# Patient Record
Sex: Male | Born: 1980 | Race: White | Hispanic: No | Marital: Single | Smoking: Current every day smoker
Health system: Southern US, Community
[De-identification: ages and names within clinical notes are randomized; demographics above are authoritative.]

## PROBLEM LIST (undated history)

## (undated) DIAGNOSIS — F419 Anxiety disorder, unspecified: Secondary | ICD-10-CM

## (undated) DIAGNOSIS — F410 Panic disorder [episodic paroxysmal anxiety] without agoraphobia: Secondary | ICD-10-CM

## (undated) HISTORY — PX: APPENDECTOMY: SHX54

## (undated) HISTORY — PX: HERNIA REPAIR: SHX51

---

## 2014-04-10 ENCOUNTER — Emergency Department (HOSPITAL_COMMUNITY)
Admission: EM | Admit: 2014-04-10 | Discharge: 2014-04-10 | Discharge status: Against Medical Advice | Payer: Medicaid Other | Source: Home / Self Care | Attending: Emergency Medicine | Admitting: Emergency Medicine

## 2014-04-10 ENCOUNTER — Emergency Department (HOSPITAL_COMMUNITY)
Admission: EM | Admit: 2014-04-10 | Discharge: 2014-04-10 | Disposition: A | Discharge status: Home / Self Care | Payer: Medicaid Other | Attending: Emergency Medicine | Admitting: Emergency Medicine

## 2014-04-10 ENCOUNTER — Encounter (HOSPITAL_COMMUNITY): Payer: Self-pay | Admitting: Emergency Medicine

## 2014-04-10 ENCOUNTER — Emergency Department (HOSPITAL_COMMUNITY): Payer: Medicaid Other

## 2014-04-10 DIAGNOSIS — R0789 Other chest pain: Secondary | ICD-10-CM | POA: Diagnosis not present

## 2014-04-10 DIAGNOSIS — R079 Chest pain, unspecified: Secondary | ICD-10-CM | POA: Insufficient documentation

## 2014-04-10 DIAGNOSIS — R51 Headache: Secondary | ICD-10-CM | POA: Insufficient documentation

## 2014-04-10 DIAGNOSIS — R1030 Lower abdominal pain, unspecified: Secondary | ICD-10-CM | POA: Insufficient documentation

## 2014-04-10 DIAGNOSIS — Z720 Tobacco use: Secondary | ICD-10-CM | POA: Insufficient documentation

## 2014-04-10 DIAGNOSIS — Z79899 Other long term (current) drug therapy: Secondary | ICD-10-CM | POA: Insufficient documentation

## 2014-04-10 DIAGNOSIS — F410 Panic disorder [episodic paroxysmal anxiety] without agoraphobia: Secondary | ICD-10-CM | POA: Diagnosis not present

## 2014-04-10 DIAGNOSIS — R110 Nausea: Secondary | ICD-10-CM | POA: Diagnosis not present

## 2014-04-10 DIAGNOSIS — Z9119 Patient's noncompliance with other medical treatment and regimen: Secondary | ICD-10-CM

## 2014-04-10 DIAGNOSIS — Z5320 Procedure and treatment not carried out because of patient's decision for unspecified reasons: Secondary | ICD-10-CM

## 2014-04-10 HISTORY — DX: Panic disorder (episodic paroxysmal anxiety): F41.0

## 2014-04-10 HISTORY — DX: Anxiety disorder, unspecified: F41.9

## 2014-04-10 LAB — BASIC METABOLIC PANEL
Anion gap: 19 — ABNORMAL HIGH (ref 5–15)
BUN: 8 mg/dL (ref 6–23)
CO2: 19 meq/L (ref 19–32)
CREATININE: 0.63 mg/dL (ref 0.50–1.35)
Calcium: 10.3 mg/dL (ref 8.4–10.5)
Chloride: 106 mEq/L (ref 96–112)
GFR calc Af Amer: >90 mL/min (ref >90)
GFR calc non Af Amer: >90 mL/min (ref >90)
GLUCOSE: 83 mg/dL (ref 70–99)
Potassium: 3.3 mEq/L — ABNORMAL LOW (ref 3.7–5.3)
Sodium: 144 mEq/L (ref 137–147)

## 2014-04-10 LAB — CBC
HEMATOCRIT: 38.7 % — AB (ref 39.0–52.0)
HEMOGLOBIN: 14 g/dL (ref 13.0–17.0)
MCH: 31.9 pg (ref 26.0–34.0)
MCHC: 36.2 g/dL — ABNORMAL HIGH (ref 30.0–36.0)
MCV: 88.2 fL (ref 78.0–100.0)
Platelets: 337 10*3/uL (ref 150–400)
RBC: 4.39 MIL/uL (ref 4.22–5.81)
RDW: 12.7 % (ref 11.5–15.5)
WBC: 8.8 10*3/uL (ref 4.0–10.5)

## 2014-04-10 LAB — RAPID URINE DRUG SCREEN, HOSP PERFORMED
Amphetamines: NOT DETECTED
BENZODIAZEPINES: NOT DETECTED
Barbiturates: NOT DETECTED
COCAINE: NOT DETECTED
Opiates: NOT DETECTED
Tetrahydrocannabinol: NOT DETECTED

## 2014-04-10 LAB — I-STAT TROPONIN, ED: Troponin i, poc: 0.01 ng/mL (ref 0.00–0.08)

## 2014-04-10 LAB — HEPATIC FUNCTION PANEL
ALT: 11 U/L (ref 0–53)
AST: 17 U/L (ref 0–37)
Albumin: 4.9 g/dL (ref 3.5–5.2)
Alkaline Phosphatase: 77 U/L (ref 39–117)
Total Bilirubin: <0.2 mg/dL — ABNORMAL LOW (ref 0.3–1.2)
Total Protein: 8.1 g/dL (ref 6.0–8.3)

## 2014-04-10 LAB — ETHANOL: Alcohol, Ethyl (B): <11 mg/dL (ref 0–11)

## 2014-04-10 LAB — LIPASE, BLOOD: LIPASE: 13 U/L (ref 11–59)

## 2014-04-10 MED ORDER — SODIUM CHLORIDE 0.9 % IV BOLUS (SEPSIS)
1000.0000 mL | Freq: Once | INTRAVENOUS | Status: AC
  Administered 2014-04-10: 1000 mL via INTRAVENOUS

## 2014-04-10 MED ORDER — LORAZEPAM 2 MG/ML IJ SOLN
2.0000 mg | Freq: Once | INTRAMUSCULAR | Status: AC
  Administered 2014-04-10: 2 mg via INTRAVENOUS
  Filled 2014-04-10: qty 1

## 2014-04-10 MED ORDER — SODIUM CHLORIDE 0.9 % IV BOLUS (SEPSIS): 1000.0000 mL | Freq: Once | INTRAVENOUS | Status: DC

## 2014-04-10 MED ORDER — NITROGLYCERIN 0.4 MG SL SUBL
SUBLINGUAL_TABLET | SUBLINGUAL | Status: AC
  Administered 2014-04-10: 0.4 mg via SUBLINGUAL
  Filled 2014-04-10: qty 1

## 2014-04-10 MED ORDER — ONDANSETRON HCL 4 MG/2ML IJ SOLN
4.0000 mg | Freq: Once | INTRAMUSCULAR | Status: AC
  Administered 2014-04-10: 4 mg via INTRAVENOUS
  Filled 2014-04-10: qty 2

## 2014-04-10 MED ORDER — HYDROMORPHONE HCL 1 MG/ML IJ SOLN
1.0000 mg | Freq: Once | INTRAMUSCULAR | Status: AC
  Administered 2014-04-10: 1 mg via INTRAVENOUS
  Filled 2014-04-10: qty 1

## 2014-04-10 MED ORDER — OXYCODONE-ACETAMINOPHEN 5-325 MG PO TABS
1.0000 | ORAL_TABLET | Freq: Once | ORAL | Status: AC
  Administered 2014-04-10: 1 via ORAL
  Filled 2014-04-10: qty 1

## 2014-04-10 MED ORDER — NITROGLYCERIN 0.4 MG SL SUBL
0.4000 mg | SUBLINGUAL_TABLET | SUBLINGUAL | Status: DC | PRN
  Administered 2014-04-10: 0.4 mg via SUBLINGUAL
  Filled 2014-04-10: qty 1

## 2014-04-10 NOTE — ED Provider Notes (Signed)
CSN: [id number]     Arrival date & time 04/10/14  1308 History   First MD Initiated Contact with Patient 04/10/14 1325     Chief Complaint  Patient presents with  . Chest Pain  . Abdominal Pain      HPI Pt was seen at 1330.  Per EMS, pt and pt's family, c/o sudden onset and persistence of constant mid-sternal chest "pain" that began PTA. Pt [facility] the science center and his symptoms began while in the snake room. Pt describes the CP as "sharp," was associated with diaphoresis, nausea and abd pain. EMS gave ASA and SL ntg without change in symptoms. Pt's family states pt has significant hx of anxiety and panic attacks, rx xanax TID. Pt describes his symptoms as similar to his usual panic attack. Denies palpitations, no SOB/cough, no vomiting/diarrhea, no back pain, no calf/LE pain or unilateral swelling, no fevers, no syncope.    Past Medical History  Diagnosis Date  . Anxiety   . Panic attack     Past Surgical History  Procedure Laterality Date  . Hernia repair    . Appendectomy      History  Substance Use Topics  . Smoking status: Current Every Day Smoker -- 1.00 packs/day  . Smokeless tobacco: Not on file  . Alcohol Use: No    Review of Systems ROS: Statement: All systems negative except as marked or noted in the HPI; Constitutional: Negative for fever and chills. ; ; Eyes: Negative for eye pain, redness and discharge. ; ; ENMT: Negative for ear pain, hoarseness, nasal congestion, sinus pressure and sore throat. ; ; Cardiovascular: +CP. Negative for palpitations, diaphoresis, dyspnea and peripheral edema. ; ; Respiratory: Negative for cough, wheezing and stridor. ; ; Gastrointestinal: +nausea, abd pain. Negative for vomiting, diarrhea, blood in stool, hematemesis, jaundice and rectal bleeding. . ; ; Genitourinary: Negative for dysuria, flank pain and hematuria. ; ; Musculoskeletal: Negative for back pain and neck pain. Negative for swelling and trauma.; ; Skin:  Negative for pruritus, rash, abrasions, blisters, bruising and skin lesion.; ; Neuro: Negative for headache, lightheadedness and neck stiffness. Negative for weakness, altered level of consciousness , altered mental status, extremity weakness, paresthesias, involuntary movement, seizure and syncope.; Psych:  +anxiety, panic attack. No SI, no SA, no HI, no hallucinations.      Allergies  Review of patient's allergies indicates no known allergies.  Home Medications   Prior to Admission medications   Medication Sig Start Date End Date Taking? Authorizing Provider  ALPRAZolam Prudy Feeler(XANAX) 1 MG tablet Take 1 mg by mouth 3 (three) times daily.   Yes Historical Provider, MD   BP 129/80  Pulse 86  Temp(Src) 98.5 F (36.9 C) (Oral)  Resp 16  Ht 5\' 7"  (1.702 m)  Wt 185 lb (83.915 kg)  BMI 28.97 kg/m2  SpO2 100% Physical Exam 1335: Physical examination:  Nursing notes reviewed; Vital signs and O2 SAT reviewed;  Constitutional: Well developed, Well nourished, Well hydrated, In no acute distress; Head:  Normocephalic, atraumatic; Eyes: EOMI, PERRL, No scleral icterus; ENMT: Mouth and pharynx normal, Mucous membranes moist; Neck: Supple, Full range of motion, No lymphadenopathy; Cardiovascular: Tachycardic rate and rhythm, No gallop; Respiratory: Breath sounds clear & equal bilaterally, No wheezes.  Speaking full sentences with ease, Normal respiratory effort/excursion; Chest: Nontender, Movement normal; Abdomen: Soft, Nontender, Nondistended, Normal bowel sounds; Genitourinary: No CVA tenderness; Extremities: Pulses normal, No tenderness, No edema, No calf edema or asymmetry.; Neuro: AA&Ox3, Major CN grossly intact.  Speech clear. No gross focal motor or sensory deficits in extremities.; Skin: Color normal, Warm, Dry.; Psych:  Anxious, generalized trembling, eyes closed with eyelids fluttering.   ED Course  Procedures     EKG Interpretation   Date/Time:  Friday April 10 2014 13:54:36  EDT Ventricular Rate:  116 PR Interval:  133 QRS Duration: 93 QT Interval:  315 QTC Calculation: 437 R Axis:   69 Text Interpretation:  Sinus tachycardia Borderline repolarization  abnormality Baseline wander Since last tracing of earlier today No  significant change was found Confirmed by [facility]  MD, Nicholos Johns [phone])  on 04/10/2014 2:39:53 PM      MDM  MDM Reviewed: nursing note and vitals Interpretation: labs, ECG and x-ray    Results for orders placed during the hospital encounter of 04/10/14  CBC      Result Value Ref Range   WBC 8.8  4.0 - 10.5 K/uL   RBC 4.39  4.22 - 5.81 MIL/uL   Hemoglobin 14.0  13.0 - 17.0 g/dL   HCT 60.4 (*) 54.0 - 98.1 %   MCV 88.2  78.0 - 100.0 fL   MCH 31.9  26.0 - 34.0 pg   MCHC 36.2 (*) 30.0 - 36.0 g/dL   RDW 19.1  47.8 - 29.5 %   Platelets 337  150 - 400 K/uL  BASIC METABOLIC PANEL      Result Value Ref Range   Sodium 144  137 - 147 mEq/L   Potassium 3.3 (*) 3.7 - 5.3 mEq/L   Chloride 106  96 - 112 mEq/L   CO2 19  19 - 32 mEq/L   Glucose, Bld 83  70 - 99 mg/dL   BUN 8  6 - 23 mg/dL   Creatinine, Ser 6.21  0.50 - 1.35 mg/dL   Calcium 30.8  8.4 - 65.7 mg/dL   GFR calc non Af Amer >90  >90 mL/min   GFR calc Af Amer >90  >90 mL/min   Anion gap 19 (*) 5 - 15  URINE RAPID DRUG SCREEN (HOSP PERFORMED)      Result Value Ref Range   Opiates NONE DETECTED  NONE DETECTED   Cocaine NONE DETECTED  NONE DETECTED   Benzodiazepines NONE DETECTED  NONE DETECTED   Amphetamines NONE DETECTED  NONE DETECTED   Tetrahydrocannabinol NONE DETECTED  NONE DETECTED   Barbiturates NONE DETECTED  NONE DETECTED  HEPATIC FUNCTION PANEL      Result Value Ref Range   Total Protein 8.1  6.0 - 8.3 g/dL   Albumin 4.9  3.5 - 5.2 g/dL   AST 17  0 - 37 U/L   ALT 11  0 - 53 U/L   Alkaline Phosphatase 77  39 - 117 U/L   Total Bilirubin <0.2 (*) 0.3 - 1.2 mg/dL   Bilirubin, Direct <8.4  0.0 - 0.3 mg/dL   Indirect Bilirubin NOT CALCULATED  0.3 - 0.9 mg/dL   LIPASE, BLOOD      Result Value Ref Range   Lipase 13  11 - 59 U/L  I-STAT TROPOININ, ED      Result Value Ref Range   Troponin i, poc 0.01  0.00 - 0.08 ng/mL   Comment 3            Dg Chest Port 1 View 04/10/2014   CLINICAL DATA:  Sudden onset chest pain.  EXAM: PORTABLE CHEST - 1 VIEW  COMPARISON:  None.  FINDINGS: The heart size and mediastinal contours are within normal  limits. Both lungs are clear. The visualized skeletal structures are unremarkable.  IMPRESSION: No active disease.   Electronically Signed   By: Maisie Fushomas  Register   On: 04/10/2014 14:14    1630:  Pt extremely anxious on arrival, trembling, laying with his eyes closed, eyelids fluttering; requesting "some pain medicines." Meds given for anxiety and pain. HR improved. BP remains stable. Pt has tol PO well without N/V. Abd remains benign. Doubt PE as cause for symptoms with low risk [place].  Doubt ACS as cause for symptoms with normal troponin and EKG, with TIMI 0/Heart score 0. Pt states he's ready to go home now. Dx and testing d/w pt.  Questions answered.  Verb understanding, agreeable to d/c home with outpt f/u.    Samuel JesterKathleen Lakeena Downie, DO 04/13/14 1719

## 2014-04-10 NOTE — ED Notes (Signed)
Portable Chest Xray at the bedside.  

## 2014-04-10 NOTE — ED Notes (Signed)
Lab results  Was given to Dr.McManus.

## 2014-04-10 NOTE — Discharge Instructions (Signed)
°Emergency Department Resource Guide °1) Find a Doctor and Pay Out of Pocket °Although you won't have to find out who is covered by your insurance plan, it is a good idea to ask around and get recommendations. You will then need to call the office and see if the doctor you have chosen will accept you as a new patient and what types of options they offer for patients who are self-pay. Some doctors offer discounts or will set up payment plans for their patients who do not have insurance, but you will need to ask so you aren't surprised when you get to your appointment. ° °2) Contact Your Local Health Department °Not all health departments have doctors that can see patients for sick visits, but many do, so it is worth a call to see if yours does. If you don't know where your local health department is, you can check in your phone book. The CDC also has a tool to help you locate your state's health department, and many state websites also have listings of all of their local health departments. ° °3) Find a Walk-in Clinic °If your illness is not likely to be very severe or complicated, you may want to try a walk in clinic. These are popping up all over the country in pharmacies, drugstores, and shopping centers. They're usually staffed by nurse practitioners or physician assistants that have been trained to treat common illnesses and complaints. They're usually fairly quick and inexpensive. However, if you have serious medical issues or chronic medical problems, these are probably not your best option. ° °No Primary Care Doctor: °- Call [facility] at  832-8000 - they can help you locate a primary care doctor that  accepts your insurance, provides certain services, etc. °- Physician Referral Service- 1-800-533-3463 ° °Chronic Pain Problems: °Organization         Address  Phone   Notes  °[facility]  (336) 297-2271 Patients need to be referred by their primary care doctor.  ° °Medication  Assistance: °Organization         Address  Phone   Notes  °[place] [facility] [address] °[place], [place] [address] (336) 641-8030 --Must be a resident of [place] °-- Must have NO insurance coverage whatsoever (no Medicaid/ Medicare, etc.) °-- The pt. MUST have a primary care doctor that directs their care regularly and follows them in the community °  °[facility]  (866) 331-1348   °[employer]  (888) 892-1162   ° °Agencies that provide inexpensive medical care: °Organization         Address  Phone   Notes  °[facility]  (336) 832-8035   °[facility]    (336) 832-7272   °[facility] [address] °[place], [place] [address] (336) 832-4777   °[facility] [address], °[place] (336) 271-4999   °[facility]    (336) 373-0678   °[facility]    (336) 272-1050   °[facility] ° [address], [place] Phone:  (336) 832-4444, Fax:  (336) 832-4440 Hours of Operation:  9 am - 6 pm, M-F.  Also accepts Medicaid/Medicare and self-pay.  °[facility] ° [address], [place] Phone: (336) 832-3150, Fax: (336) [phone]. Hours of Operation:  8:30 am - 5:30 pm, M-F.  Also accepts Medicaid and self-pay.  °[facility] [place] [address], [place] Phone: (336) 878-6027   °[facility] [address], [place], [place] (336)723-1848, Ext. 123 Mondays & Thursdays: 7-9 AM.  First 15 patients are seen on a first come, first serve basis. °  ° °Medicaid-accepting [place] Providers: ° °Organization         Address  Phone   Notes  °[facility] [address], [place] (336) 641-2100 Also accepts self-pay patients.  °[facility] [address], [place] ° (336) 856-9996   °[facility] [address], [place]  (336) 288-8857   °[facility] [address] [place] [address], [place] (336) 299-7000   °Veita Bland [address], [place]  ° (336) 373-1557 Only accepts [place] [facility] Medicaid patients after they have their name applied to their card.  ° °Self-Pay (no insurance) in [place]: ° °Organization         Address  Phone   Notes  °Sickle Cell Patients, [facility] [address], [place] (336) 832-1970   °[facility] [address], [place] (336) 832-4400   ° [facility] ° [address], [place] (336) 992-4800   °[facility]/Dr. Osei-Bonsu ° [address] [place] [address], [place] or [address], [place] (336) [phone] Phone number for both [place] and [place] locations is the same.  °[facility] [address], [place] (336) 299-0000   °[facility] [address] [place] [address], [place] or [address] (336) 852-7530 °(336) 878-2260   °[facility] [address], [place] (336) 350-1642, phone; (336) 294-5005, fax Sees patients 1st and 3rd Saturday of every month.  Must not qualify for public or private insurance (i.e. Medicaid, Medicare, [place] Health Choice, Veterans' Benefits) • Household income should be no more than 200% of the poverty level •The clinic cannot treat you if you are pregnant or think you are pregnant • Sexually transmitted diseases are not treated at the clinic.  ° ° °Dental Care: °Organization         Address  Phone  Notes  °[place] [facility] [address], [place] (336) 641-6152 Accepts children up to age 21 who are enrolled in Medicaid or [place] Health Choice; pregnant women with a Medicaid card; and children who have applied for Medicaid or [place] Health Choice, but were declined, whose parents can pay a reduced fee at time of service.  °[place]  [facility] [place]  [address], [place] (336) 641-7733 Accepts children up to age 21 who are enrolled in Medicaid or [place] Health Choice; pregnant women with a Medicaid card; and children who have applied for Medicaid or [place] Health Choice, but were declined, whose parents can pay a reduced fee at time of service.  °[facility] ° [address],  (336) 641-4533 Patients are seen by appointment only. Walk-ins are not accepted. [facility] will see patients 18 years of age and older. °Monday - Tuesday (8am-5pm) °Most Wednesdays (8:30-5pm) °$30 per visit, cash only  °[facility] ° [address], [place] (336) 641-4533 Patients are seen by appointment only. Walk-ins are not accepted. [facility] will see patients 18 years of age and older. °One   Wednesday Evening (Monthly: Volunteer Based).  $30 per visit, cash only  °[facility]  (919) 537-3737 for adults; Children under age 4, call Graduate Pediatric Dentistry at ([phone]) [phone]. Children aged 4-14, please call (919) [phone] to request a pediatric application. ° Dental services are provided in all areas of dental care including fillings, crowns and bridges, complete and partial dentures, implants, gum treatment, root canals, and extractions. Preventive care is also provided. Treatment is provided to both adults and children. °Patients are selected via a lottery and there is often a waiting list. °  °[facility] [address], °[place] ° (336) 763-8833 [email or web address] °  °[facility] [address], [place], [place] (336)723-1848, Ext. 123 Second and Fourth Thursday of each month, opens at 6:30 AM; Clinic ends at 9 AM.  Patients are seen on a first-come first-served basis, and a limited number are seen during each clinic.  ° °[facility] ° [address], [place], [place] (336) 723-7904    Eligibility Requirements °You must have lived in [place], [place], or [place] counties for at least the last three months. °  You cannot be eligible for state or federal sponsored healthcare insurance, including Veterans Administration, Medicaid, or Medicare. °  You generally cannot be eligible for healthcare insurance through your employer.  °  How to apply: °Eligibility screenings are held every Tuesday and Wednesday afternoon from 1:00 pm until 4:00 pm. You do not need an appointment for the interview!  °[facility] [address], [place], [place] 336-631-2330   °[facility]  336-342-8273   °[facility]  336-703-3100   °[facility]  336-570-6415   ° °Behavioral Health Resources in the Community: °Intensive Outpatient Programs °Organization         Address  Phone  Notes  °[facility] [address], [place], [place] 336-878-6098   °[facility] [address], [place], [place] 336-832-9800   °[facility] [address], [place], [place] ° 336-882-2125   °[facility] [address],  °[place], [place] 1-800-853-5163 or 336-641-4981   °Substance Abuse Resources °Organization         Address  Phone  Notes  °[facility]  336-882-2125   °[facility]  336-784-9470   °[facility]  336-285-9073   °[facility]  336-845-3988   °Residential & Outpatient Substance Abuse Program  1-800-659-3381   °Psychological Services °Organization         Address  Phone  Notes  °[facility]  336- 832-9600   °[facility]  336- 378-7881   °[facility] [address], [place] 1-800-853-5163 or 336-641-4981   ° °Mobile Crisis Teams °Organization         Address  Phone  Notes  °[facility], Mobile Crisis Care Unit  1-877-626-1772   °Assertive °[facility] ° [address]  [place], [place] 336-834-9664   °Sharon DeEsch [address] °[place] 336-554-5454   ° °Self-Help/Support Groups °Organization         Address  Phone             Notes  °[facility]  - variety of support groups  336- 373-1402 Call for more information  °Narcotics Anonymous (NA), [facility] [address], °[place]  2 meetings at this location  ° °  Residential Treatment Programs °Organization         Address  Phone  Notes  °[facility] [address],    °[place]  1-866-801-8205   °[facility] ° [address], [place], [place] 704-293-8524   °[facility] [address], [place] 336-845-3988 Admissions: 8am-3pm M-F  °[facility] [address]    °[place], [place] 336-841-1104   °[facility] [address], [place], [place] 336-379-7146   °[facility] [address]  °[place], [place] 336-285-9073   °[facility] Intensive Outpatient [address], [place], [place] 336-852-3033   °[facility] [address]  °[place], [place] 1-877-615-2722 or 336-784-9470   °[facility] [address] [place], [place] 336-227-7417 Accepts Medicaid  °[facility] [address]  °[place] 1-800-659-3381 Substance Abuse/Addiction Treatment  ° °[place] Behavioral Health Resources °Organization         Address  Phone  Notes  °[facility]  (888) 581-9988   °Julie Brannon, PhD [address] [place], [place]   (336) 349-5553 or (336) 951-0000   °[facility]   [address] °[place], [place] (336) 349-4454   °[facility] [address], [place], [place] (336) 342-8316 Insurance/Medicaid/sponsorship through [facility]  °[facility] [address]                                    [place], [place] (336) 342-8316 Therapy/tele-psych/case    °[facility] [address]  ° [place], [place] (336) 349-2233    °Dr. Arfeen  (336) 349-4544   °[facility] [place]  [employer] [place] [facility] 1) [address], [place] °2) [address], [place] °3)  [address], [place] (336) 349-3220 °(336) 342-7768 ° °(336) 342-8140   °[place] [facility] (336) 342-1394 or (336) 342-3537 (After Hours)    ° ° ° °Take your usual prescriptions as previously directed. Take over the counter tylenol and ibuprofen, as directed on packaging, as needed for discomfort. Apply moist heat or ice to the area(s) of discomfort, for 15 minutes at a time, several times per day for the next few days.  Do not fall asleep on a heating or ice pack.  Call your regular medical doctor on Monday to schedule a follow up appointment in the next 2 days.  Return to the Emergency Department immediately if worsening. ° °

## 2014-04-10 NOTE — ED Notes (Signed)
Pt. Called x2 with no answer

## 2014-04-10 NOTE — ED Notes (Signed)
Per EMS: Pt was out with daughter and wife at science center when he had sudden onset of left sided sharp cp while in the snake room, pt with hx of anxiety and states he has had cp before with panic attacks. Family noted on scene pt became red in face and sweaty when cp started. Pt reported abd pain and nausea en route. 243mg  of aspirin given en route. Pt normally takes zanax tid. Pt shaking upon arrival. axox 4.

## 2014-04-10 NOTE — ED Notes (Signed)
The pt was just discharged from this ed a few minutes ago.  He is here for chest pain and a headache.  hge reports that he  Was walking across the parking lot and felt like he was going to pass out.  Hyperventilating at present

## 2014-04-10 NOTE — ED Notes (Signed)
Pt. Called with no answer 

## 2014-04-10 NOTE — ED Notes (Signed)
Velna HatchetSheila in main lab states she will add on lipase and hepatic function to lab work drawn.

## 2014-04-11 NOTE — ED Provider Notes (Signed)
Pt checked back in after d/c, but left before bring seen after discharge.   1. Left before treatment completed      Toy CookeyMegan Docherty, MD 04/11/14 [id number]

## 2015-12-18 IMAGING — CR DG CHEST 1V PORT
1 series · 1 of 1 positions shown · non-contrast
Comparison: None.

CLINICAL DATA: Sudden onset chest pain.

EXAM:
PORTABLE CHEST - 1 VIEW

[AP]
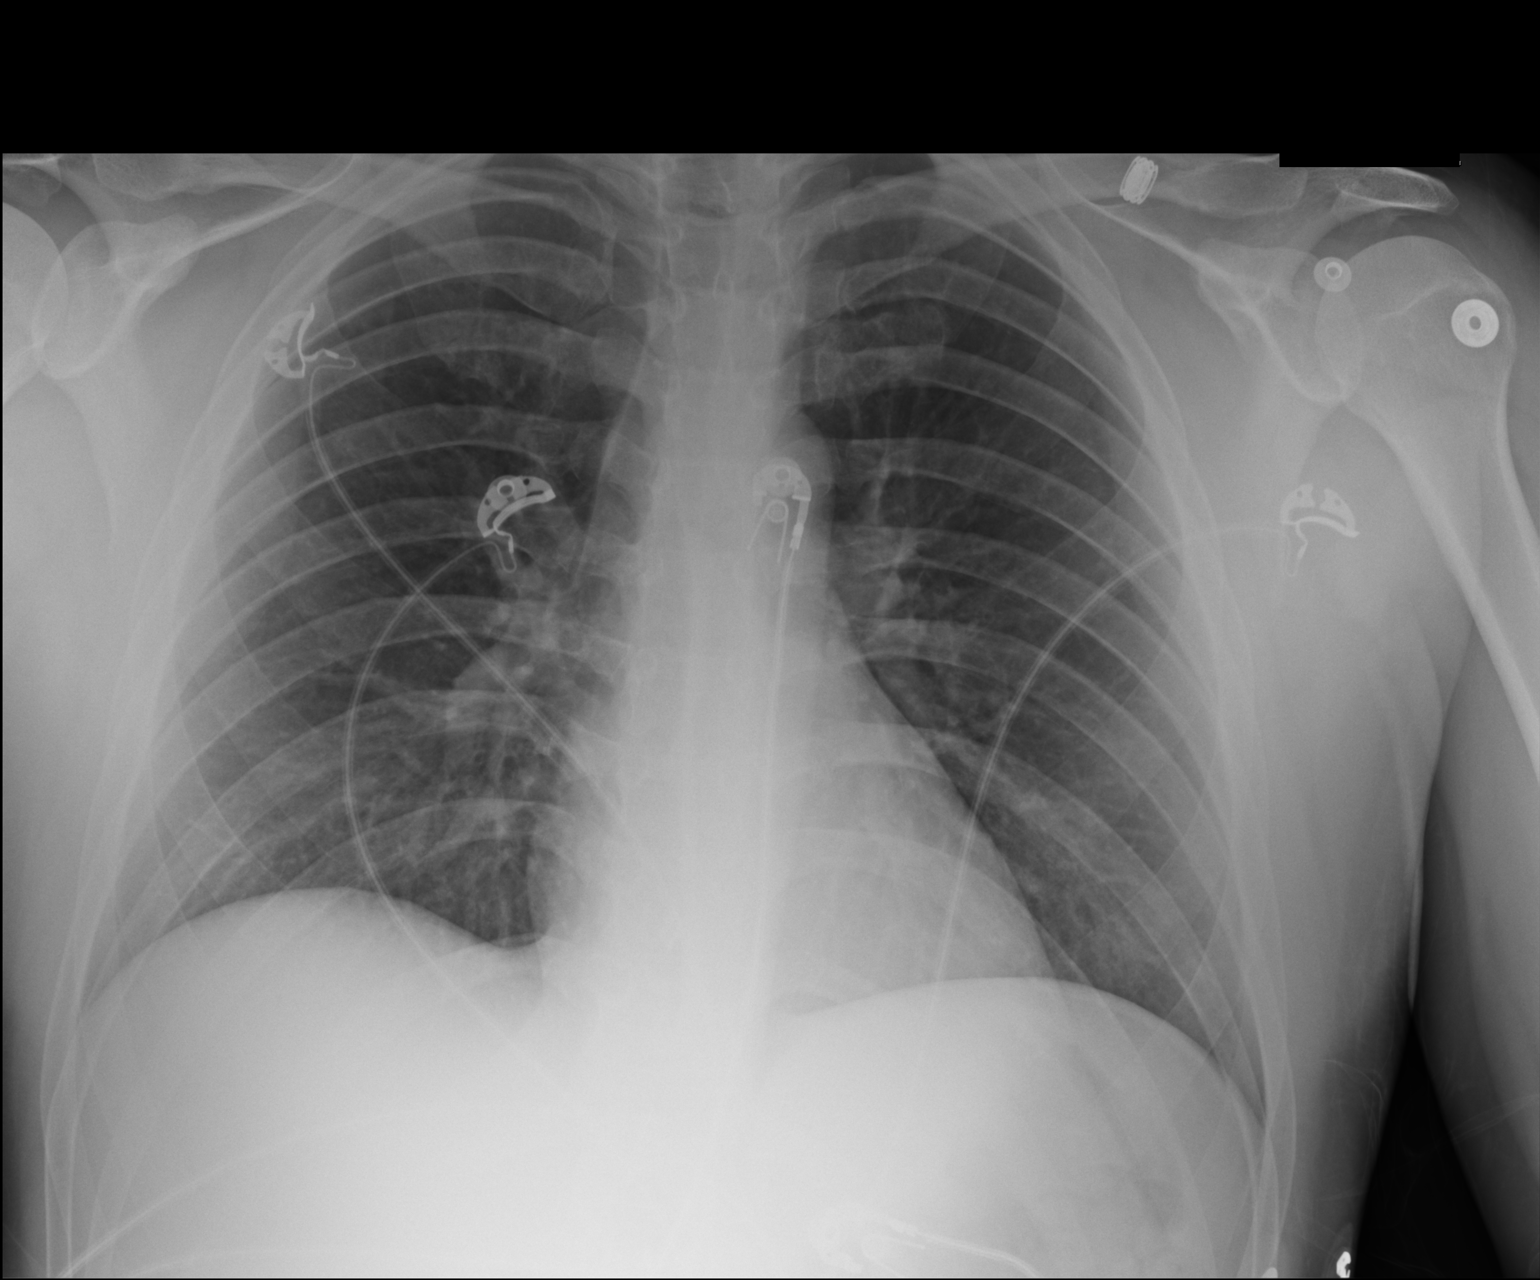

[1 of 1 positions shown; findings below may reference images not displayed]

FINDINGS: The heart size and mediastinal contours are within normal limits.
Both lungs are clear. The visualized skeletal structures are
unremarkable.
IMPRESSION: No active disease.
